# Patient Record
Sex: Female | Born: 1995
Health system: Southern US, Community
[De-identification: ages and names within clinical notes are randomized; demographics above are authoritative.]

## PROBLEM LIST (undated history)

## (undated) DIAGNOSIS — G8929 Other chronic pain: Secondary | ICD-10-CM

## (undated) DIAGNOSIS — M549 Dorsalgia, unspecified: Secondary | ICD-10-CM

## (undated) DIAGNOSIS — M25859 Other specified joint disorders, unspecified hip: Secondary | ICD-10-CM

## (undated) HISTORY — DX: Other specified joint disorders, unspecified hip: M25.859

## (undated) HISTORY — DX: Dorsalgia, unspecified: M54.9

## (undated) HISTORY — DX: Other chronic pain: G89.29

---

## 2015-08-25 ENCOUNTER — Encounter: Payer: Self-pay | Admitting: Family Medicine

## 2015-08-25 ENCOUNTER — Ambulatory Visit (INDEPENDENT_AMBULATORY_CARE_PROVIDER_SITE_OTHER): Payer: 59 | Admitting: Family Medicine

## 2015-08-25 DIAGNOSIS — M545 Low back pain, unspecified: Secondary | ICD-10-CM | POA: Insufficient documentation

## 2015-08-25 NOTE — Progress Notes (Signed)
   Yolanda Robinson is a 20 y.o. female who presents to [facility] today for back pain. 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 Over the last 4 weeks she's been having worsening right-sided low back pain. She had physical therapy including exercise program, dry needling, trigger point injection, and e-stim which did not help much. She denies any radiating pain or bowel bladder dysfunction. She notes worsening right low back pain with flexion and with extension. She's tried over-the-counter medicines for pain which is only helped a little. She feels well otherwise with no fevers or chills vomiting or diarrhea. She Denies any injury or significant change to her training program.  Patient notes she had a normal lumbar x-ray at her other physician's office.   No past medical history on file.  No Significant past medical or surgical history No past surgical history on file. Social History  Substance Use Topics  . Smoking status: Never Smoker  . Smokeless tobacco: Never Used  . Alcohol use Not on file   family history is not on file.  ROS:  No headache, visual changes, nausea, vomiting, diarrhea, constipation, dizziness, abdominal pain, skin rash, fevers, chills, night sweats, weight loss, swollen lymph nodes, body aches, joint swelling, muscle aches, chest pain, shortness of breath, mood changes, visual or auditory hallucinations.    Medications: Current Outpatient Prescriptions  Medication Sig Dispense Refill  . BLISOVI FE 1/20 1-20 MG-MCG tablet     . fexofenadine (ALLEGRA) 30 MG tablet Take 30 mg by mouth 2 (two) times daily.    . meloxicam (MOBIC) 15 MG tablet Take 15 mg by mouth daily. with food  2   No current facility-administered medications for this visit.    No Known Allergies   Exam:  BP (!) 147/84   Pulse 73   Wt 156 lb (70.8 kg)  General: Well Developed, well nourished, and in no acute distress.  Neuro/Psych: Alert and  oriented x3, extra-ocular muscles intact, able to move all 4 extremities, sensation grossly intact. Skin: Warm and dry, no rashes noted.  Respiratory: Not using accessory muscles, speaking in full sentences, trachea midline.  Cardiovascular: Pulses palpable, no extremity edema. Abdomen: Does not appear distended. MSK: Back: Nontender to spinal midline. Minimally tender right SI joint. Back motion is normal: Pain with flexion and extension. Normal rotation and lateral flexion. Reflexes of the lower extremities are equal and normal bilaterally. Lower extremity strength is equal and normal throughout. No pain with pelvic shift. Negative slump test and straight-legtest bilaterally. Mildly positive right-sided stork test. Normal gait.    No results found for this or any previous visit (from the past 24 hour(s)). No results found.   Assessment and Plan: 20 y.o. female with right low back pain. Concerning for pars stress fracture or discitis or lumbosacral strain. As this is been ongoing for 4 weeks and worsening despite adequate therapeutic treatment with physical therapy I think is reasonable to proceed with MRI to further evaluate etiology of pain. Patient will obtain MRI and return to clinic following MRI.   Discussed warning signs or symptoms. Please see discharge instructions. Patient expresses understanding.

## 2015-08-25 NOTE — Patient Instructions (Signed)
Thank you for coming in today. Get MRI.  Return a few days after MRI for follow up.

## 2015-08-27 ENCOUNTER — Ambulatory Visit (HOSPITAL_BASED_OUTPATIENT_CLINIC_OR_DEPARTMENT_OTHER)
Admission: RE | Admit: 2015-08-27 | Discharge: 2015-08-27 | Disposition: A | Discharge status: Home / Self Care | Payer: 59 | Source: Ambulatory Visit | Attending: Family Medicine | Admitting: Family Medicine

## 2015-08-27 DIAGNOSIS — M545 Low back pain, unspecified: Secondary | ICD-10-CM

## 2015-08-27 DIAGNOSIS — M5126 Other intervertebral disc displacement, lumbar region: Secondary | ICD-10-CM | POA: Insufficient documentation

## 2015-08-27 DIAGNOSIS — M5127 Other intervertebral disc displacement, lumbosacral region: Secondary | ICD-10-CM | POA: Diagnosis not present

## 2015-08-29 ENCOUNTER — Ambulatory Visit (INDEPENDENT_AMBULATORY_CARE_PROVIDER_SITE_OTHER): Payer: 59 | Admitting: Family Medicine

## 2015-08-29 VITALS — BP 130/79 | HR 78 | Wt 159.0 lb

## 2015-08-29 DIAGNOSIS — M545 Low back pain, unspecified: Secondary | ICD-10-CM

## 2015-08-29 NOTE — Progress Notes (Signed)
Yolanda Robinson is a 20 y.o. female who presents to [facility] today for follow-up back MRI. As noted previously patient has had back pain for about a month now. She's had some physical therapy and had minimal to no improvement. Over the last 2 weeks and she's been at home her back pain has worsened. She feels the back pain most dominantly in her right low back with leg and upper body extension. She denies any radiating pain especially below the knee. No bowel or bladder dysfunction is present. As she failed to improve she had an MRI to evaluate for potential pars stress fracture. Fortunately the MRI did not show any evidence of partial stress fracture only bulging discs at L5-S1 and L4-L5.     No past medical history on file. No past surgical history on file. Social History  Substance Use Topics  . Smoking status: Never Smoker  . Smokeless tobacco: Never Used  . Alcohol use Not on file   family history is not on file.  ROS as above:  Medications: Current Outpatient Prescriptions  Medication Sig Dispense Refill  . BLISOVI FE 1/20 1-20 MG-MCG tablet     . fexofenadine (ALLEGRA) 30 MG tablet Take 30 mg by mouth 2 (two) times daily.    . meloxicam (MOBIC) 15 MG tablet Take 15 mg by mouth daily. with food  2   No current facility-administered medications for this visit.    No Known Allergies   Exam:  BP 130/79   Pulse 78   Wt 159 lb (72.1 kg)  Gen: Well NAD Back: Nontender to spinal midline.  Tender palpation at the lumbosacral paraspinal muscle as it inserts onto the sacrum. Pain is worse with right leg resisted extension and upper body resisted extension. No pain with motion of the pelvis including pressure overlying the ASIS bilaterally or pretzel stretch bilaterally Negative straight leg raise test. Negative slump test.   Lower extremity strength is equal  and normal throughout.  Normal gait.   Mr Lumbar Spine Wo Contrast  Result Date: 08/27/2015 CLINICAL DATA:  Right side low back and right hip pain for 5 weeks. No known injury. Initial encounter. EXAM: MRI LUMBAR SPINE WITHOUT CONTRAST TECHNIQUE: Multiplanar, multisequence MR imaging of the lumbar spine was performed. No intravenous contrast was administered. COMPARISON:  None. FINDINGS: Segmentation:  Unremarkable. Alignment:  Maintained. Vertebrae:  No fracture or worrisome marrow lesion. Conus medullaris: Extends to the L1 level and appears normal. Paraspinal and other soft tissues: Unremarkable. Disc levels: T11-12 and T12-L1 are imaged in the sagittal plane only and negative. L1-2:  Negative. L2-3:  Negative. L3-4:  Negative. L4-5: The patient has a broad-based left paracentral protrusion deforming the left aspect of the thecal sac and encroaching on the descending left L5 root. The foramina are open. L5-S1: Disc bulge with a superimposed right lateral recess protrusion. The protrusion impinges on the descending right S1 root. There is also mild narrowing in the left lateral recess without nerve root compression. The foramina are open. IMPRESSION: Right lateral recess protrusion superimposed on a shallow disc bulge at L5-S1 impinges on the descending right S1 root. The bulge causes mild narrowing in the left lateral recess without compression of the left S1 root. Broad-based central and left paracentral protrusion at L4-5 impinges on the descending left L5 root and deforms the ventral thecal sac. Electronically Signed   By: Drusilla Kanner M.D.   On: 08/27/2015 14:49  Assessment and Plan: 20 y.o. female with right low back pain. Although patient certainly has bulging disks seen on MRI she does not have any signs or symptoms of lower extremity radicular pain. Her pain is near the SI joint but she is pain-free with manipulation of the SI joint. Her pain seems to be predominantly located at the  right lumbar paraspinal muscle group. I think her pain is almost entirely due to relative core weakness and muscle imbalance. I've given her some core stabilization exercises to start doing and recommend that she follow-up with her team physician and athletic trainer when she returns to school next week. I would recommend a core stabilization program. Both the patient and her mother expressed understanding and agreement    Discussed warning signs or symptoms. Please see discharge instructions. Patient expresses understanding.

## 2015-08-29 NOTE — Patient Instructions (Signed)
Thank you for coming in today. Do the core back exercises.  Return as needed.

## 2015-11-09 DIAGNOSIS — M25859 Other specified joint disorders, unspecified hip: Secondary | ICD-10-CM

## 2015-11-09 HISTORY — PX: OTHER SURGICAL HISTORY: SHX169

## 2015-11-09 HISTORY — DX: Other specified joint disorders, unspecified hip: M25.859

## 2017-04-09 LAB — GC/CHLAMYDIA PROBE AMP, GENITAL
CANDIDA KRUSEI, DNA: NEGATIVE
Chlamydia, Nuc. Acid Amp: NEGATIVE
GONOCOCCUS, NUC. ACID AMP: NEGATIVE
Gardnerella vaginosis: NEGATIVE
TRICHOMONAS: NEGATIVE

## 2017-04-10 LAB — HM PAP SMEAR: HM Pap smear: NEGATIVE

## 2017-05-06 ENCOUNTER — Encounter: Payer: Self-pay | Admitting: Family Medicine

## 2017-05-06 ENCOUNTER — Ambulatory Visit (INDEPENDENT_AMBULATORY_CARE_PROVIDER_SITE_OTHER): Payer: 59 | Admitting: Family Medicine

## 2017-05-06 DIAGNOSIS — M545 Low back pain: Secondary | ICD-10-CM

## 2017-05-06 DIAGNOSIS — M549 Dorsalgia, unspecified: Secondary | ICD-10-CM

## 2017-05-06 DIAGNOSIS — G8929 Other chronic pain: Secondary | ICD-10-CM | POA: Diagnosis not present

## 2017-05-06 HISTORY — DX: Other chronic pain: G89.29

## 2017-05-06 NOTE — Patient Instructions (Addendum)
Thank you for coming in today. Get blood work today.  I will get results to you ASAP.   Continue to work on core strength.  Consider low back dry needing with PT.   TENS UNIT: This is helpful for muscle pain and spasm.   Search and Purchase a TENS 7000 2nd edition at  www.tenspros.com or www.Amazon.com It should be less than $30.     TENS unit instructions: Do not shower or bathe with the unit on Turn the unit off before removing electrodes or batteries If the electrodes lose stickiness add a drop of water to the electrodes after they are disconnected from the unit and place on plastic sheet. If you continued to have difficulty, call the TENS unit company to purchase more electrodes. Do not apply lotion on the skin area prior to use. Make sure the skin is clean and dry as this will help prolong the life of the electrodes. After use, always check skin for unusual red areas, rash or other skin difficulties. If there are any skin problems, does not apply electrodes to the same area. Never remove the electrodes from the unit by pulling the wires. Do not use the TENS unit or electrodes other than as directed. Do not change electrode placement without consultating your therapist or physician. Keep 2 fingers with between each electrode. Wear time ratio is 2:1, on to off times.    For example on for 30 minutes off for 15 minutes and then on for 30 minutes off for 15 minutes    Return as needed. I am here for you.  Send me mychart message as needed.

## 2017-05-06 NOTE — Progress Notes (Signed)
Yolanda Robinson is a 22 y.o. female who presents to [facility] today for back pain. Yolanda Robinson has had an extensive history of right-sided low back pain for years.  She is a Dentist at [employer].  She participated on the swim team.  She notes over the past 2 years she has had pretty extensive low back pain and right hip pain.  She brings a stack of medical records documenting physical therapy workup including MRIs of her hip and back.  Ultimately she had a right hip debridement for  labrum tear and femoral acetabular impingement.  Additionally she had facet injections and ultimately medial branch blocks of her right L2-S1 facets.  The hip surgery helped some of the hip pain but the facet injections and ablations did not help her back pain much at all.  She notes continued moderate right low back pain worse with prolonged standing or prolonged sitting.  She no longer is swimming and had effectively retire from swimming.  Her goals are to have less pain and be able to exercise normally without significant pain.  Records will be scanned   Past Medical History:  Diagnosis Date  . Chronic back pain 05/06/2017  . Femoral acetabular impingement 11/09/2015   right s/p repair   Past Surgical History:  Procedure Laterality Date  . Femoral osteoplasty Right 11/09/2015  . Hip labrum repair Right 11/09/2015  . Psoas tendon release Right 11/09/2015   Social History   Tobacco Use  . Smoking status: Never Smoker  . Smokeless tobacco: Never Used  Substance Use Topics  . Alcohol use: Not on file     ROS:  As above   Medications: Current Outpatient Medications  Medication Sig Dispense Refill  . fexofenadine (ALLEGRA) 30 MG tablet Take 30 mg by mouth 2 (two) times daily.    . medroxyPROGESTERone Acetate 150 MG/ML SUSY     . BLISOVI FE 1/20 1-20 MG-MCG tablet     . meloxicam (MOBIC) 15 MG tablet Take 15 mg by mouth daily. with food  2   No current  facility-administered medications for this visit.    No Known Allergies   Exam:  BP 132/80 (BP Location: Right Arm, Patient Position: Sitting, Cuff Size: Normal)   Pulse 60   Temp 98 F (36.7 C) (Oral)   Ht 5' 9.5" (1.765 m)   Wt 147 lb (66.7 kg)   BMI 21.40 kg/m  General: Well Developed, well nourished, and in no acute distress.  Muscular appearing young woman Neuro/Psych: Alert and oriented x3, extra-ocular muscles intact, able to move all 4 extremities, sensation grossly intact. Skin: Warm and dry, no rashes noted.  Respiratory: Not using accessory muscles, speaking in full sentences, trachea midline.  Cardiovascular: Pulses palpable, no extremity edema. Abdomen: Does not appear distended. MSK:  L-spine normal-appearing no deformity. Nontender along spinal midline.  Not particular tender along the paraspinal muscles as well Lumbar motion is intact Patient has delayed voluntary contraction of the paraspinal muscles on the right with core stabilization exercises. Hip range of motion is normal bilaterally. Slightly decreased right hip flexion strength Normal gait      Assessment and Plan: 22 y.o. female with  Persistent right low back pain.  Patient has had extensive treatment without much benefit.  I do not think were going to be able to get her completely pain-free with any intervention.  I do not think that further facet injections or nerve ablations are likely to provide much benefit  either.  Based on her exam today she continues to have some core weakness and delayed voluntary contraction of her lumbar musculature.  I do think she could benefit from physical therapy and recommend a retrial of core strengthening physical therapy including potentially dry needling.  Additionally I believe she would benefit from a rheumatologic workup listed below to evaluate for psoriatic arthritis or ankylosing spondylitis.  Recheck as needed.  Will obtain records from OB/GYN as  well.    Orders Placed This Encounter  Procedures  . HLA-B27 antigen  . CBC with Differential/Platelet  . Sedimentation rate  . ANA  . CK  . Rheumatoid factor  . Ambulatory referral to Physical Therapy    Referral Priority:   Routine    Referral Type:   Physical Medicine    Referral Reason:   Specialty Services Required    Requested Specialty:   Physical Therapy    Number of Visits Requested:   1   No orders of the defined types were placed in this encounter.   Discussed warning signs or symptoms. Please see discharge instructions. Patient expresses understanding.   I spent 25 minutes with this patient, greater than 50% was face-to-face time counseling regarding ddx and treatment plan.

## 2017-05-08 LAB — CBC WITH DIFFERENTIAL/PLATELET
Basophils Absolute: 42 cells/uL (ref 0–200)
Basophils Relative: 0.8 %
EOS ABS: 281 {cells}/uL (ref 15–500)
Eosinophils Relative: 5.4 %
HCT: 42.5 % (ref 35.0–45.0)
HEMOGLOBIN: 14.7 g/dL (ref 11.7–15.5)
Lymphs Abs: 1326 cells/uL (ref 850–3900)
MCH: 32.1 pg (ref 27.0–33.0)
MCHC: 34.6 g/dL (ref 32.0–36.0)
MCV: 92.8 fL (ref 80.0–100.0)
MPV: 10.4 fL (ref 7.5–12.5)
Monocytes Relative: 4.8 %
NEUTROS ABS: 3302 {cells}/uL (ref 1500–7800)
Neutrophils Relative %: 63.5 %
PLATELETS: 318 10*3/uL (ref 140–400)
RBC: 4.58 10*6/uL (ref 3.80–5.10)
RDW: 11.6 % (ref 11.0–15.0)
TOTAL LYMPHOCYTE: 25.5 %
WBC: 5.2 10*3/uL (ref 3.8–10.8)
WBCMIX: 250 {cells}/uL (ref 200–950)

## 2017-05-08 LAB — SEDIMENTATION RATE: SED RATE: 11 mm/h (ref 0–20)

## 2017-05-08 LAB — ANA: ANA: POSITIVE — AB

## 2017-05-08 LAB — CK: CK TOTAL: 197 U/L — AB (ref 29–143)

## 2017-05-08 LAB — ANTI-NUCLEAR AB-TITER (ANA TITER): ANA Titer 1: 1:40 {titer} — ABNORMAL HIGH

## 2017-05-08 LAB — HLA-B27 ANTIGEN: HLA-B27 Antigen: NEGATIVE

## 2017-05-08 LAB — RHEUMATOID FACTOR: Rhuematoid fact SerPl-aCnc: 79 IU/mL — ABNORMAL HIGH (ref <14)

## 2017-05-15 ENCOUNTER — Encounter: Payer: Self-pay | Admitting: Family Medicine

## 2017-05-28 ENCOUNTER — Telehealth: Payer: Self-pay | Admitting: Family Medicine

## 2017-05-28 ENCOUNTER — Encounter: Payer: Self-pay | Admitting: General Practice

## 2017-05-28 DIAGNOSIS — M545 Low back pain, unspecified: Secondary | ICD-10-CM

## 2017-05-28 DIAGNOSIS — R768 Other specified abnormal immunological findings in serum: Secondary | ICD-10-CM | POA: Insufficient documentation

## 2017-05-28 NOTE — Telephone Encounter (Signed)
Need referral to rheumatology

## 2018-02-27 IMAGING — MR MR LUMBAR SPINE W/O CM
4 of 5 series · 28 of 48 positions shown · non-contrast
Comparison: None.

CLINICAL DATA: Right side low back and right hip pain for 5 weeks.
No known injury. Initial encounter.

EXAM:
MRI LUMBAR SPINE WITHOUT CONTRAST
TECHNIQUE: Multiplanar, multisequence MR imaging of the lumbar spine was
performed. No intravenous contrast was administered.

[Series 2: T1 · sagittal · 4.0mm · 0.51mm/px · 6 of 15 slices shown (1 of 2)]
[im 1/15]
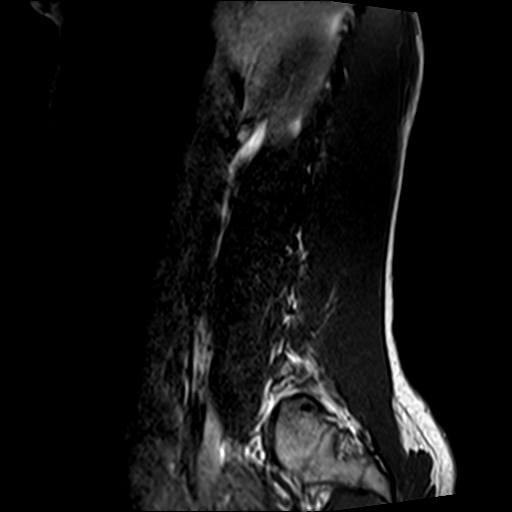
[im 3/15]
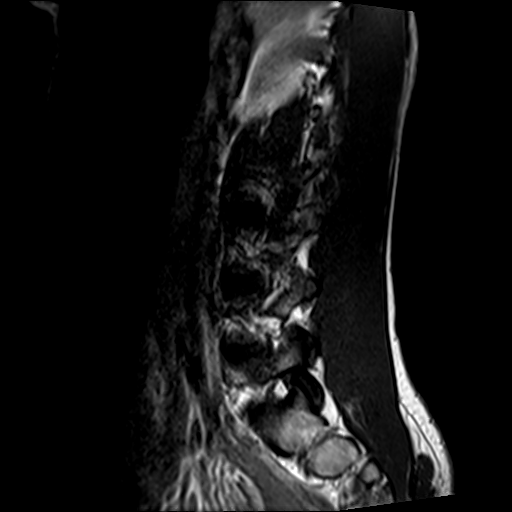
[im 6/15]
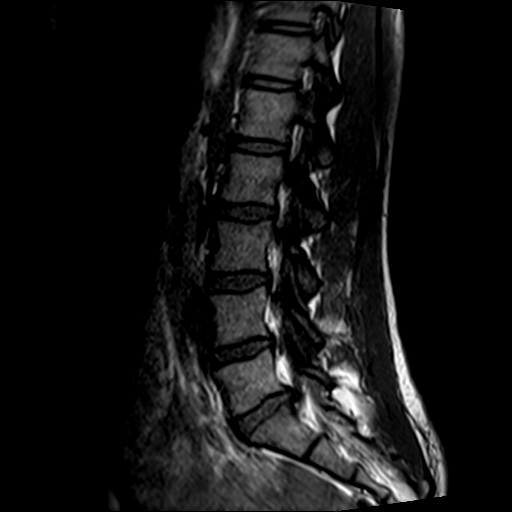
[im 9/15]
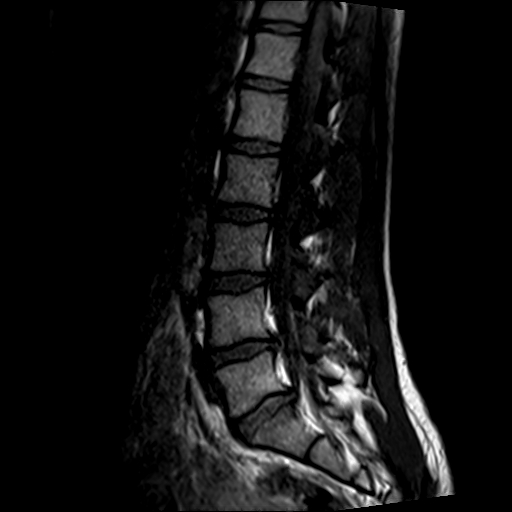
[im 12/15]
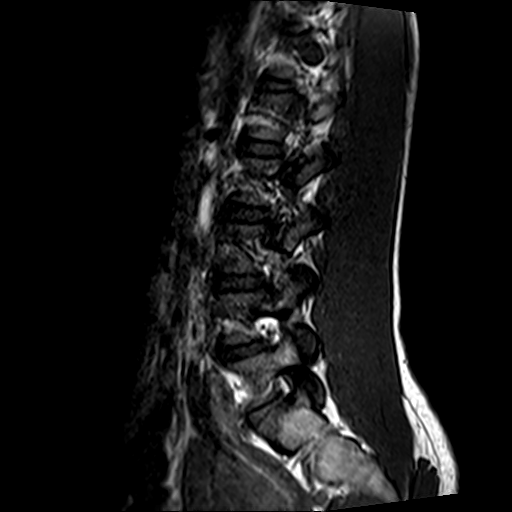
[im 15/15]
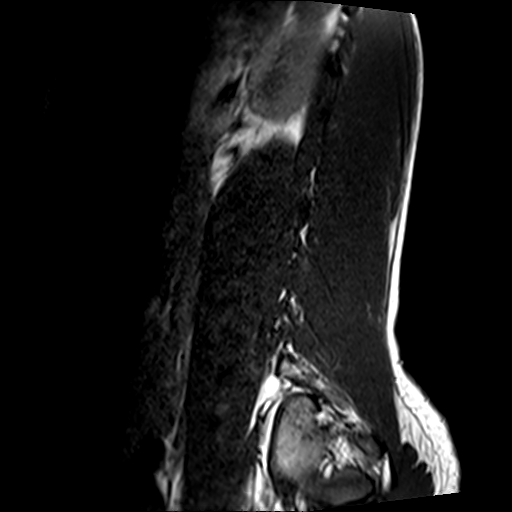

[Series 3: T2 · sagittal · 4.0mm · 0.81mm/px · 7 of 15 slices shown (1 of 2)]
[im 1/15]
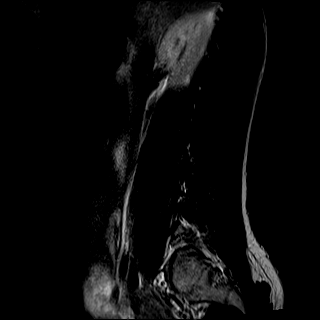
[im 3/15]
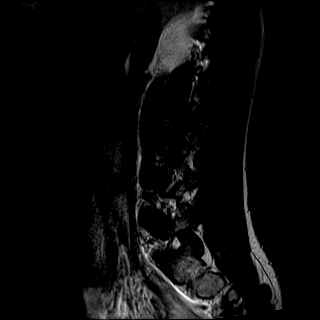
[im 5/15]
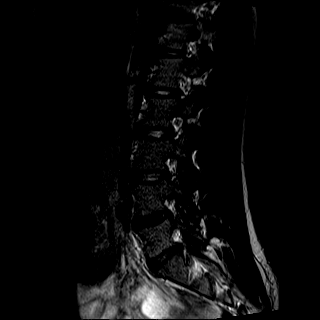
[im 8/15]
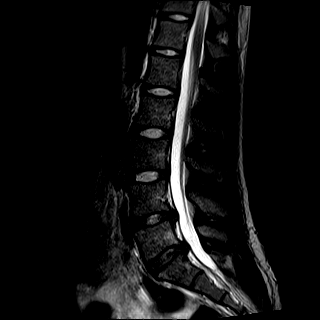
[im 10/15]
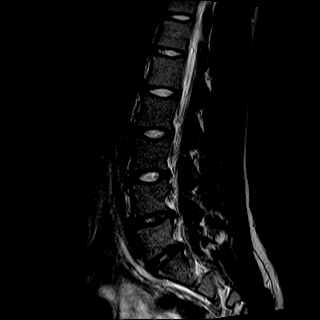
[im 12/15]
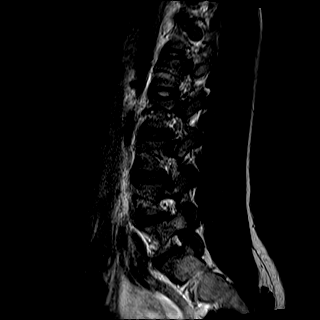
[im 15/15]
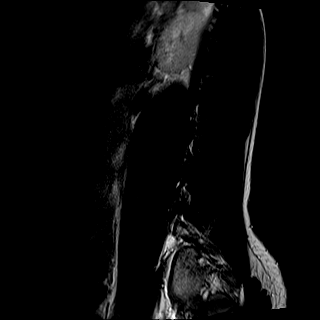

[Series 5: T2 · axial · 4.0mm · 0.39mm/px · z∈[-103,+76]mm · 8 of 32 slices shown (2 of 2)]
[im 1/32]
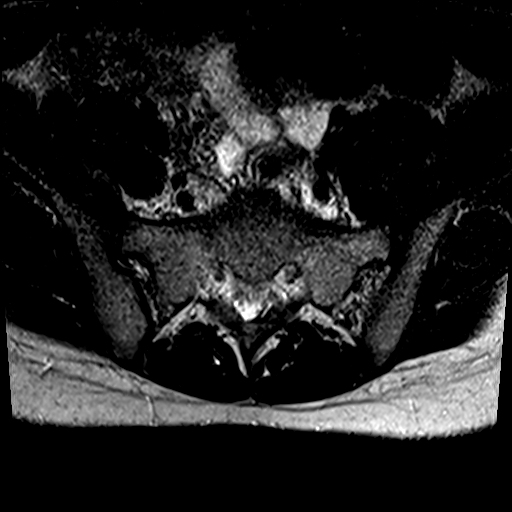
[im 5/32]
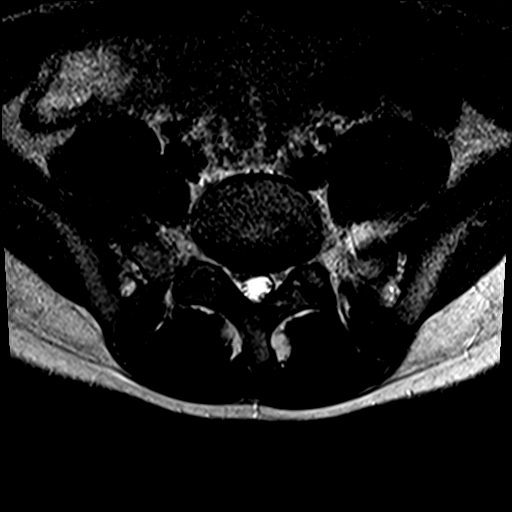
[im 10/32]
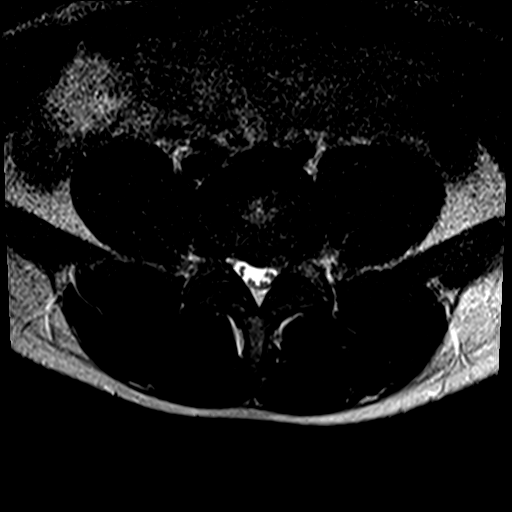
[im 15/32]
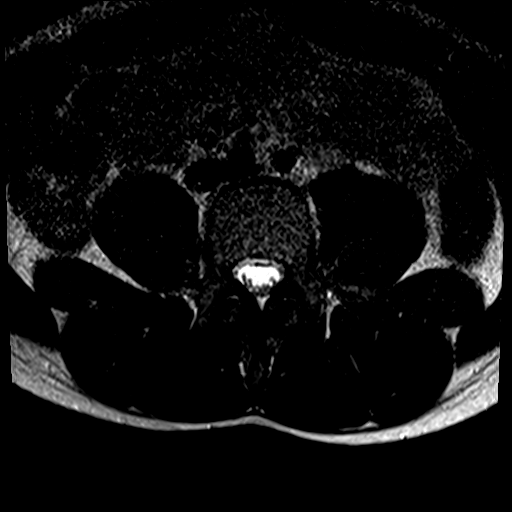
[im 17/32]
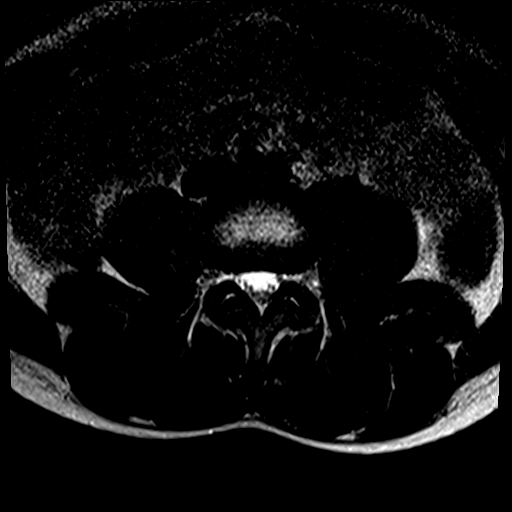
[im 22/32]
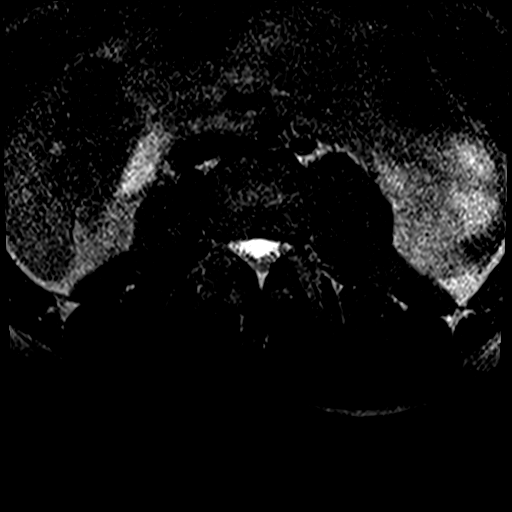
[im 27/32]
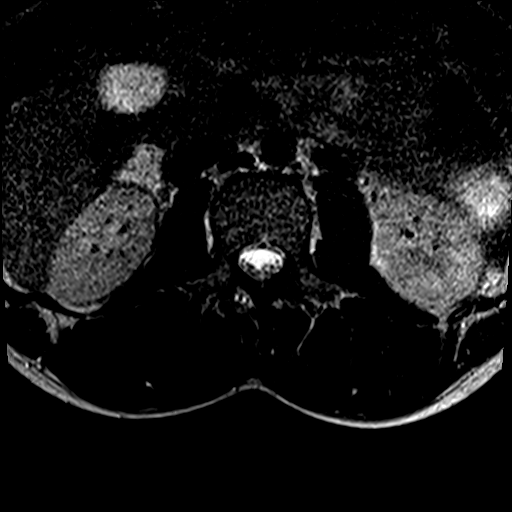
[im 32/32]
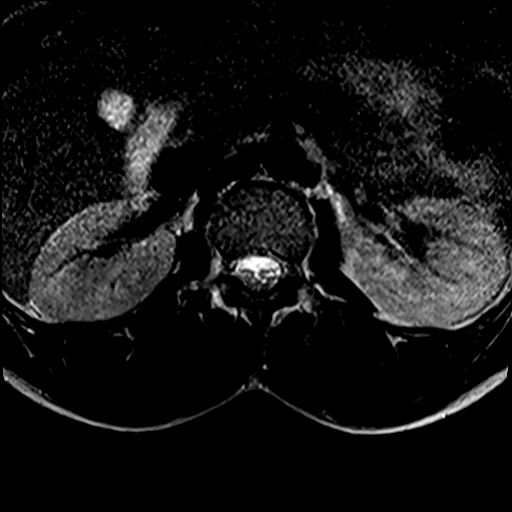

[Series 6: T1 · axial · 4.0mm · 0.78mm/px · z∈[-103,+51]mm · 7 of 32 slices shown (2 of 2)]
[im 1/32]
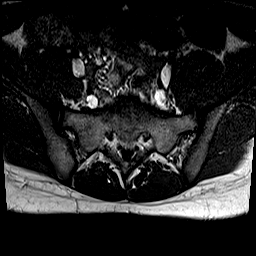
[im 5/32]
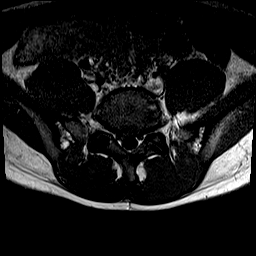
[im 10/32]
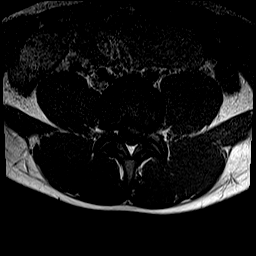
[im 15/32]
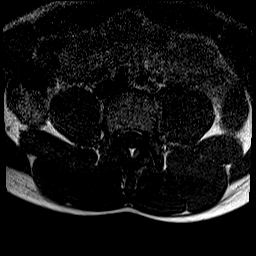
[im 17/32]
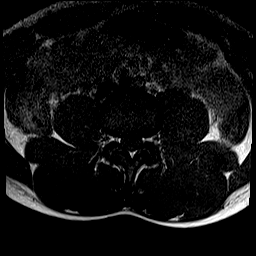
[im 22/32]
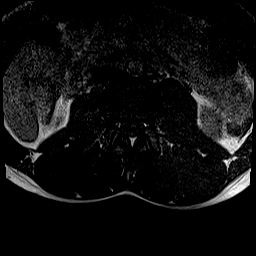
[im 27/32]
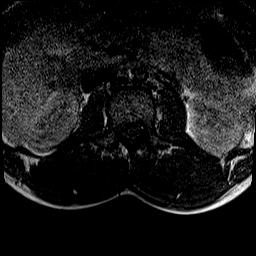

[28 of 48 positions shown; findings below may reference images not displayed]

FINDINGS: Segmentation:  Unremarkable.

Alignment:  Maintained.

Vertebrae:  No fracture or worrisome marrow lesion.

Conus medullaris: Extends to the L1 level and appears normal.

Paraspinal and other soft tissues: Unremarkable.

Disc levels:

T11-12 and T12-L1 are imaged in the sagittal plane only and
negative.

L1-2:  Negative.

L2-3:  Negative.

L3-4:  Negative.

L4-5: The patient has a broad-based left paracentral protrusion
deforming the left aspect of the thecal sac and encroaching on the
descending left L5 root. The foramina are open.

L5-S1: Disc bulge with a superimposed right lateral recess
protrusion. The protrusion impinges on the descending right S1 root.
There is also mild narrowing in the left lateral recess without
nerve root compression. The foramina are open.
IMPRESSION: Right lateral recess protrusion superimposed on a shallow disc bulge
at L5-S1 impinges on the descending right S1 root. The bulge causes
mild narrowing in the left lateral recess without compression of the
left S1 root.

Broad-based central and left paracentral protrusion at L4-5 impinges
on the descending left L5 root and deforms the ventral thecal sac.
